# Patient Record
Sex: Male | Born: 1970 | Race: Black or African American | Hispanic: No | Marital: Married | Smoking: Current every day smoker
Health system: Southern US, Community
[De-identification: ages and names within clinical notes are randomized; demographics above are authoritative.]

## PROBLEM LIST (undated history)

## (undated) DIAGNOSIS — I671 Cerebral aneurysm, nonruptured: Secondary | ICD-10-CM

## (undated) HISTORY — PX: NASAL FRACTURE SURGERY: SHX718

## (undated) HISTORY — PX: BRAIN SURGERY: SHX531

---

## 2009-02-18 ENCOUNTER — Emergency Department (HOSPITAL_BASED_OUTPATIENT_CLINIC_OR_DEPARTMENT_OTHER): Admission: EM | Admit: 2009-02-18 | Discharge: 2009-02-18 | Payer: Self-pay | Admitting: Emergency Medicine

## 2009-03-25 ENCOUNTER — Ambulatory Visit: Payer: Self-pay | Admitting: Diagnostic Radiology

## 2009-03-25 ENCOUNTER — Emergency Department (HOSPITAL_BASED_OUTPATIENT_CLINIC_OR_DEPARTMENT_OTHER): Admission: EM | Admit: 2009-03-25 | Discharge: 2009-03-25 | Payer: Self-pay | Admitting: Emergency Medicine

## 2010-05-27 ENCOUNTER — Emergency Department (HOSPITAL_BASED_OUTPATIENT_CLINIC_OR_DEPARTMENT_OTHER)
Admission: EM | Admit: 2010-05-27 | Discharge: 2010-05-27 | Payer: Self-pay | Source: Home / Self Care | Admitting: Emergency Medicine

## 2010-07-30 LAB — DIFFERENTIAL
Basophils Absolute: 0.1 10*3/uL (ref 0.0–0.1)
Basophils Relative: 2 % — ABNORMAL HIGH (ref 0–1)
Eosinophils Absolute: 0.1 10*3/uL (ref 0.0–0.7)
Lymphs Abs: 2.2 10*3/uL (ref 0.7–4.0)
Neutrophils Relative %: 53 % (ref 43–77)

## 2010-07-30 LAB — COMPREHENSIVE METABOLIC PANEL
ALT: 48 U/L (ref 0–53)
CO2: 26 mEq/L (ref 19–32)
Calcium: 9.1 mg/dL (ref 8.4–10.5)
Chloride: 108 mEq/L (ref 96–112)
GFR calc non Af Amer: >60 mL/min (ref >60)
Glucose, Bld: 87 mg/dL (ref 70–99)
Sodium: 146 mEq/L — ABNORMAL HIGH (ref 135–145)
Total Bilirubin: 0.8 mg/dL (ref 0.3–1.2)

## 2010-07-30 LAB — URINALYSIS, ROUTINE W REFLEX MICROSCOPIC
Ketones, ur: 15 mg/dL — AB
Nitrite: NEGATIVE
Protein, ur: NEGATIVE mg/dL
Urobilinogen, UA: 0.2 mg/dL (ref 0.0–1.0)

## 2010-07-30 LAB — CBC
Hemoglobin: 14.1 g/dL (ref 13.0–17.0)
MCHC: 34.1 g/dL (ref 30.0–36.0)
MCV: 88.9 fL (ref 78.0–100.0)
RBC: 4.64 MIL/uL (ref 4.22–5.81)

## 2010-07-30 LAB — LIPASE, BLOOD: Lipase: 36 U/L (ref 23–300)

## 2010-07-31 LAB — CBC
Platelets: 162 10*3/uL (ref 150–400)
RDW: 13.6 % (ref 11.5–15.5)
WBC: 9.5 10*3/uL (ref 4.0–10.5)

## 2010-07-31 LAB — URINALYSIS, ROUTINE W REFLEX MICROSCOPIC
Glucose, UA: NEGATIVE mg/dL
Ketones, ur: 15 mg/dL — AB
Nitrite: NEGATIVE
Protein, ur: NEGATIVE mg/dL
pH: 8 (ref 5.0–8.0)

## 2010-07-31 LAB — BASIC METABOLIC PANEL
BUN: 12 mg/dL (ref 6–23)
Creatinine, Ser: 0.9 mg/dL (ref 0.4–1.5)
GFR calc non Af Amer: >60 mL/min (ref >60)
Glucose, Bld: 90 mg/dL (ref 70–99)

## 2010-07-31 LAB — RAPID STREP SCREEN (MED CTR MEBANE ONLY): Streptococcus, Group A Screen (Direct): NEGATIVE

## 2010-07-31 LAB — DIFFERENTIAL
Basophils Absolute: 0.1 10*3/uL (ref 0.0–0.1)
Eosinophils Absolute: 0.1 10*3/uL (ref 0.0–0.7)
Lymphocytes Relative: 7 % — ABNORMAL LOW (ref 12–46)
Lymphs Abs: 0.7 10*3/uL (ref 0.7–4.0)
Neutrophils Relative %: 85 % — ABNORMAL HIGH (ref 43–77)

## 2011-01-08 IMAGING — CR DG ABDOMEN ACUTE W/ 1V CHEST
3 series · 3 of 3 positions shown · non-contrast
Comparison: None

CLINICAL DATA: Lower abdominal pain, nausea, vomiting and diarrhea;
blood in stool.

ACUTE ABDOMEN SERIES (ABDOMEN 2 VIEW & CHEST 1 VIEW)

[w chest pa]
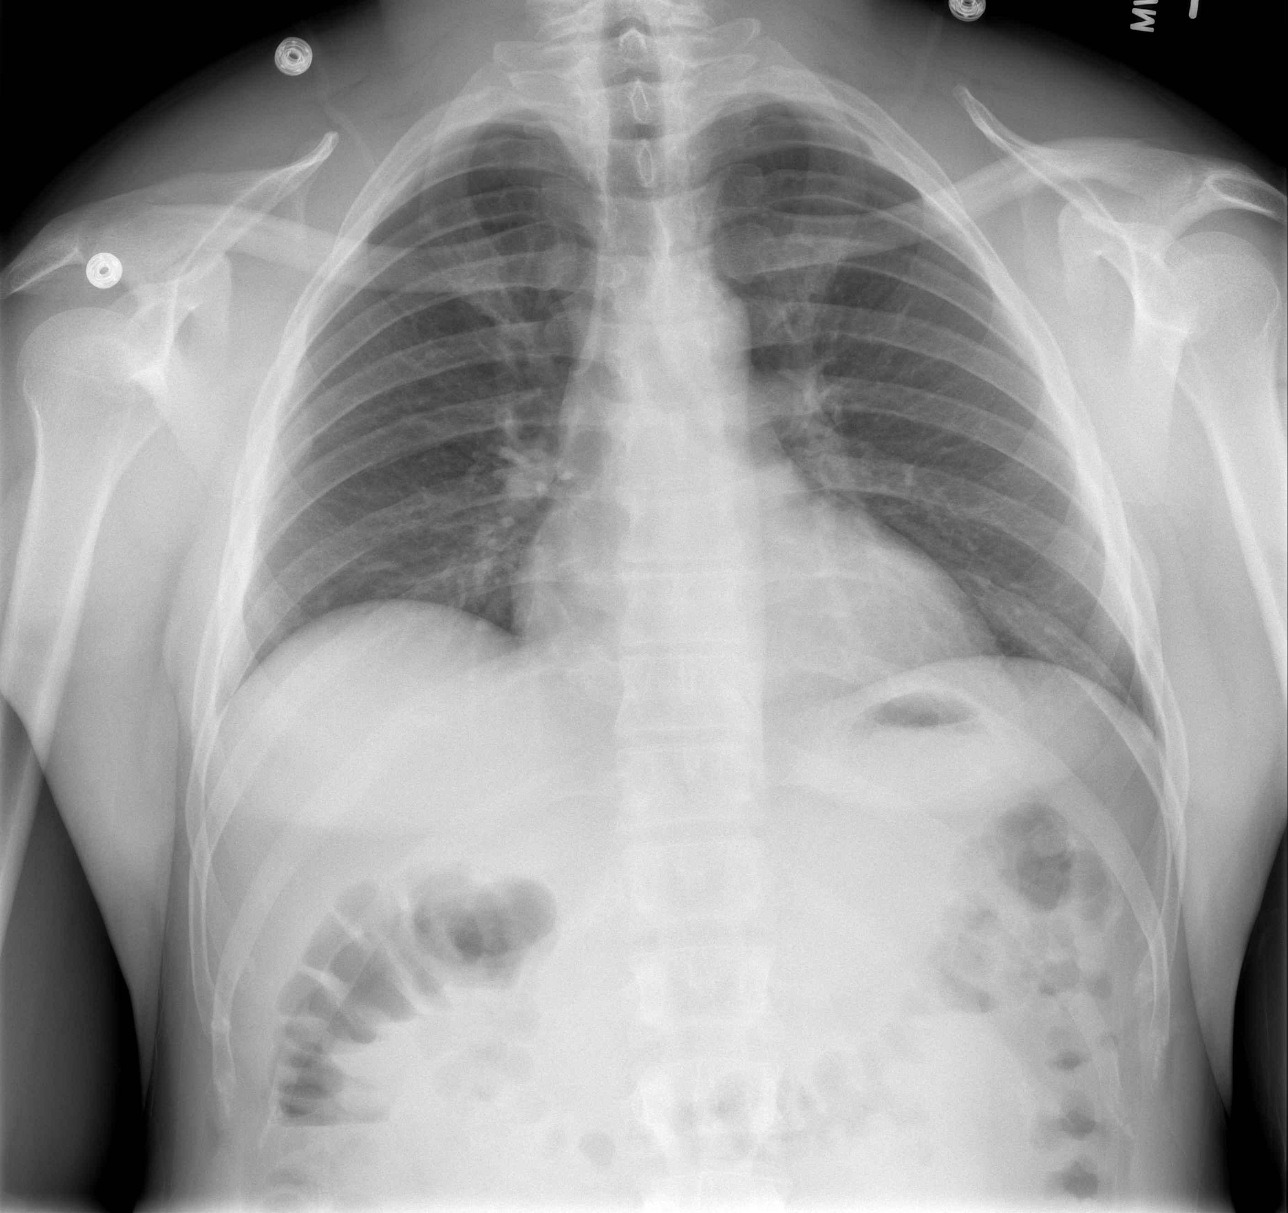

[w abdomen upright]
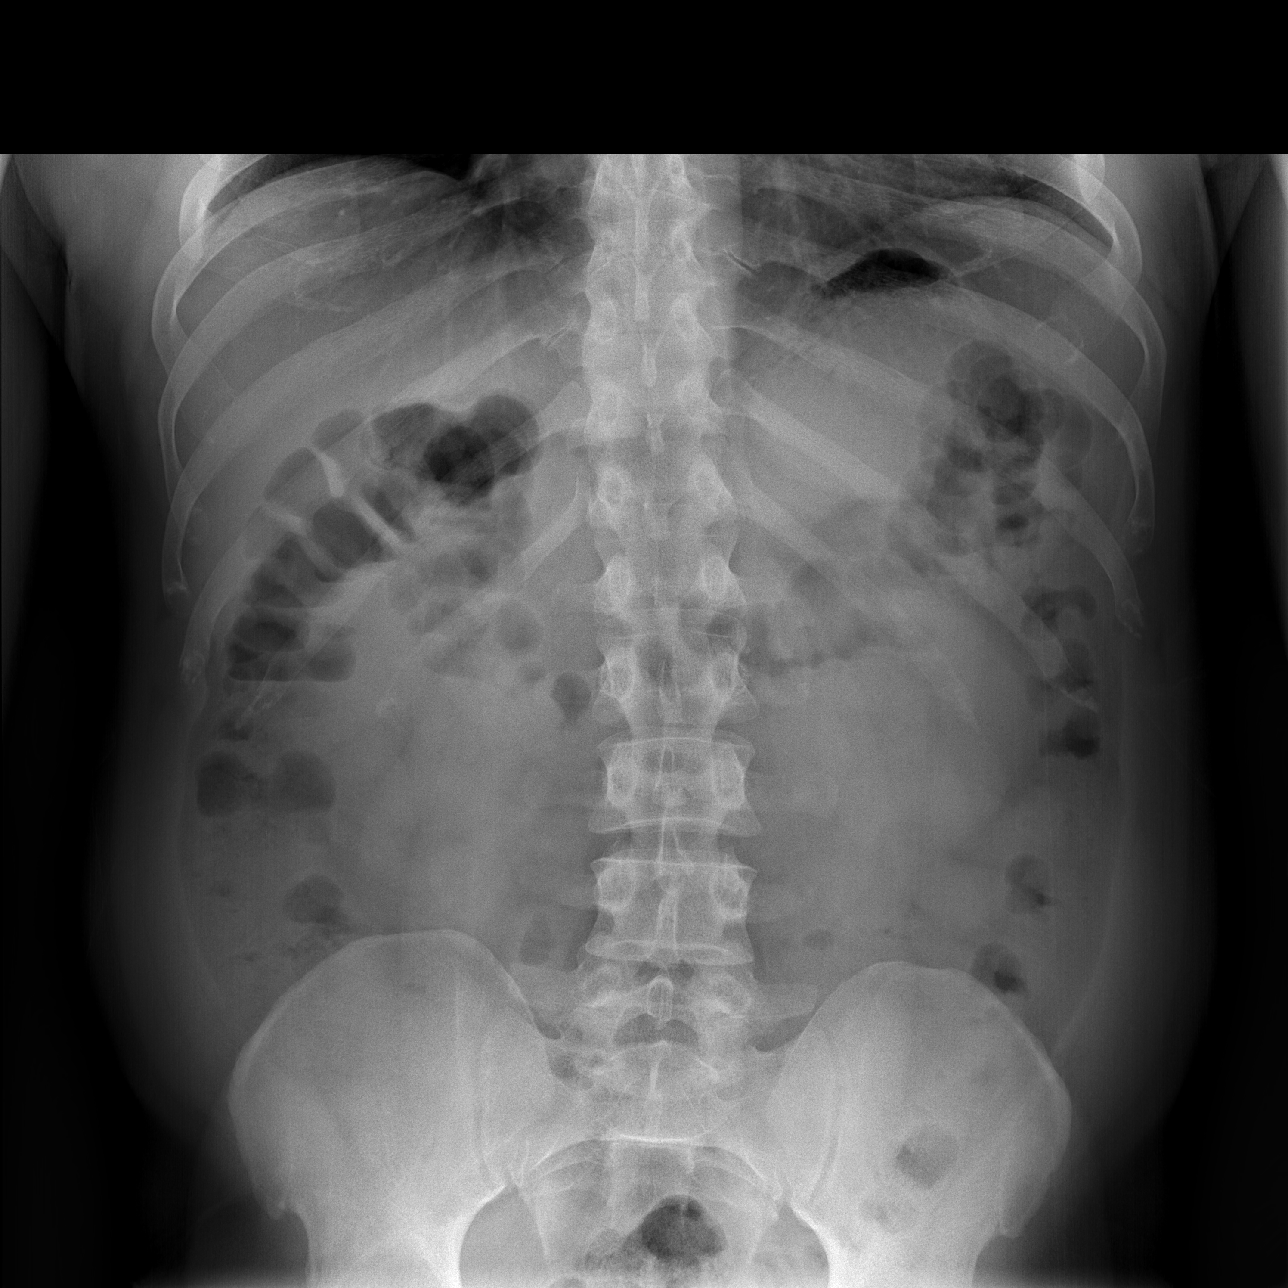

[t abdomen supine]
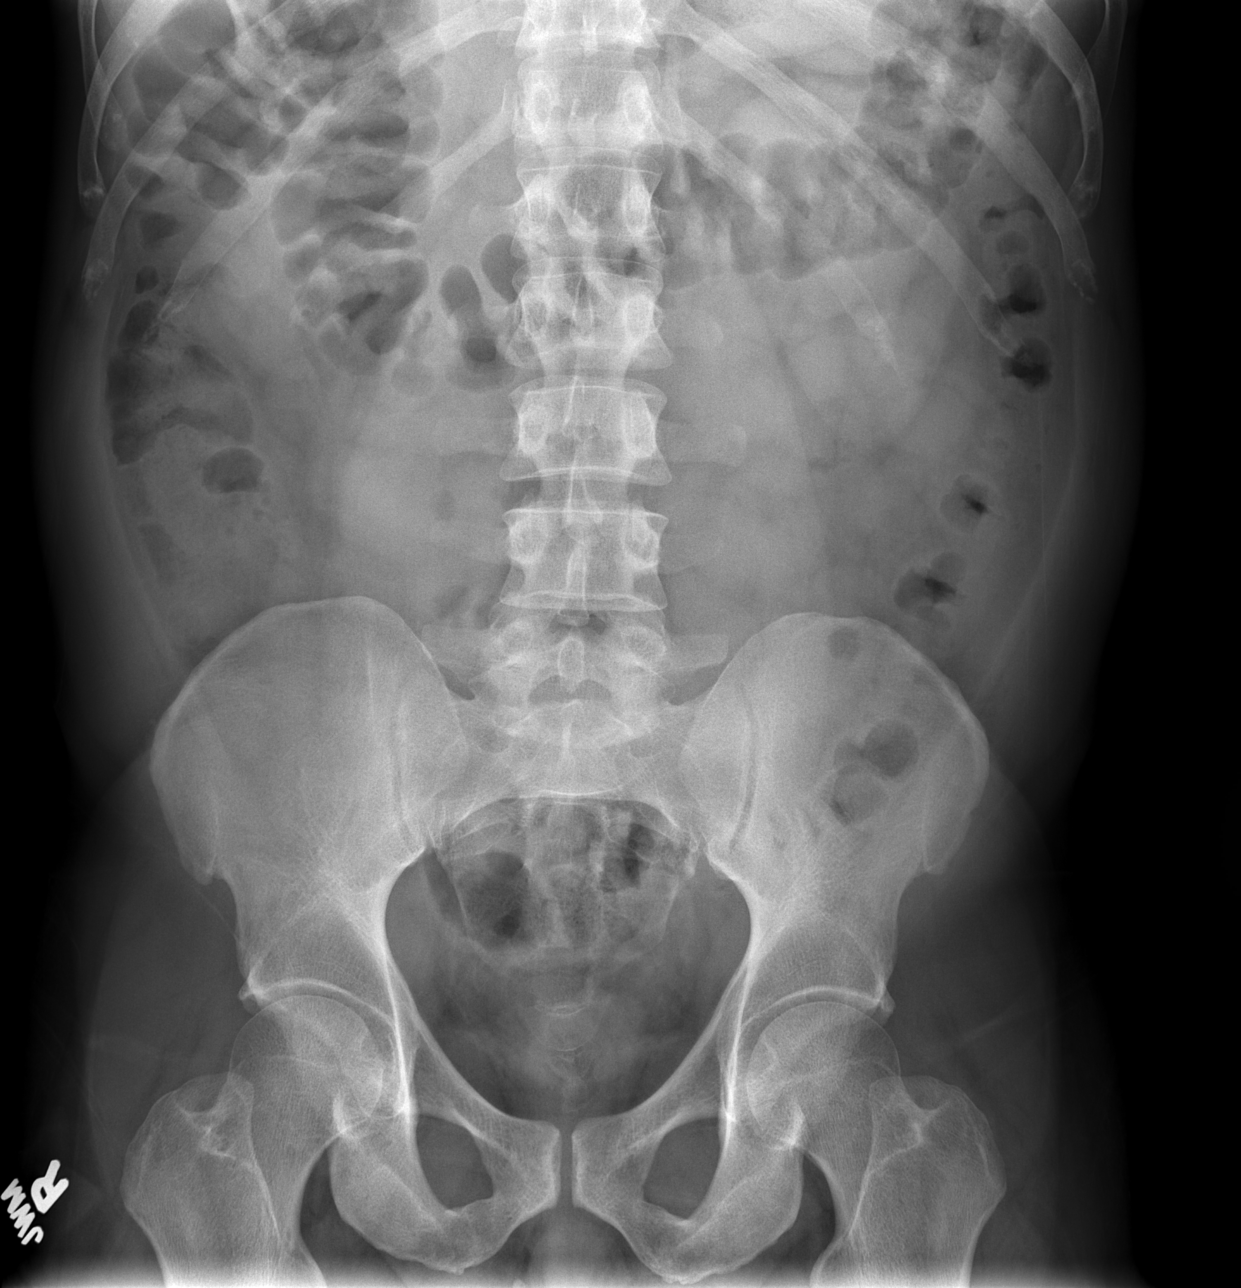

[3 of 3 positions shown; findings below may reference images not displayed]

FINDINGS: The lungs are well-aerated and clear.  There is no
evidence of focal opacification, pleural effusion or pneumothorax.
The cardiomediastinal silhouette is within normal limits.

The visualized bowel gas pattern is unremarkable. Stool and air are
noted within the colon; there is no evidence of small bowel
dilatation to suggest obstruction.  No free intra-abdominal air is
identified on the provided upright view.

No acute osseous abnormalities are seen; the sacroiliac joints are
unremarkable in appearance.
IMPRESSION: 1.  Unremarkable bowel gas pattern; no free intra-abdominal air
seen.
2.  No acute cardiopulmonary process identified.

## 2022-03-21 ENCOUNTER — Encounter (HOSPITAL_BASED_OUTPATIENT_CLINIC_OR_DEPARTMENT_OTHER): Payer: Self-pay | Admitting: Emergency Medicine

## 2022-03-21 ENCOUNTER — Emergency Department (HOSPITAL_BASED_OUTPATIENT_CLINIC_OR_DEPARTMENT_OTHER)
Admission: EM | Admit: 2022-03-21 | Discharge: 2022-03-21 | Disposition: A | Discharge status: Home / Self Care | Payer: 59 | Attending: Emergency Medicine | Admitting: Emergency Medicine

## 2022-03-21 ENCOUNTER — Other Ambulatory Visit: Payer: Self-pay

## 2022-03-21 ENCOUNTER — Emergency Department (HOSPITAL_BASED_OUTPATIENT_CLINIC_OR_DEPARTMENT_OTHER): Payer: 59

## 2022-03-21 DIAGNOSIS — G44019 Episodic cluster headache, not intractable: Secondary | ICD-10-CM | POA: Insufficient documentation

## 2022-03-21 DIAGNOSIS — R519 Headache, unspecified: Secondary | ICD-10-CM | POA: Diagnosis present

## 2022-03-21 HISTORY — DX: Cerebral aneurysm, nonruptured: I67.1

## 2022-03-21 LAB — BASIC METABOLIC PANEL
Anion gap: 5 (ref 5–15)
BUN: 11 mg/dL (ref 6–20)
CO2: 27 mmol/L (ref 22–32)
Calcium: 8.7 mg/dL — ABNORMAL LOW (ref 8.9–10.3)
Chloride: 106 mmol/L (ref 98–111)
Creatinine, Ser: 1.19 mg/dL (ref 0.61–1.24)
GFR, Estimated: >60 mL/min (ref >60)
Glucose, Bld: 86 mg/dL (ref 70–99)
Potassium: 4.2 mmol/L (ref 3.5–5.1)
Sodium: 138 mmol/L (ref 135–145)

## 2022-03-21 MED ORDER — DIPHENHYDRAMINE HCL 50 MG/ML IJ SOLN
25.0000 mg | Freq: Once | INTRAMUSCULAR | Status: AC
  Administered 2022-03-21: 25 mg via INTRAVENOUS
  Filled 2022-03-21: qty 1

## 2022-03-21 MED ORDER — PROCHLORPERAZINE EDISYLATE 10 MG/2ML IJ SOLN
10.0000 mg | Freq: Once | INTRAMUSCULAR | Status: AC
  Administered 2022-03-21: 10 mg via INTRAVENOUS
  Filled 2022-03-21: qty 2

## 2022-03-21 MED ORDER — IOHEXOL 350 MG/ML SOLN
75.0000 mL | Freq: Once | INTRAVENOUS | Status: AC | PRN
  Administered 2022-03-21: 80 mL via INTRAVENOUS

## 2022-03-21 MED ORDER — SODIUM CHLORIDE 0.9 % IV BOLUS
500.0000 mL | Freq: Once | INTRAVENOUS | Status: AC
  Administered 2022-03-21: 500 mL via INTRAVENOUS

## 2022-03-21 NOTE — Discharge Instructions (Signed)
Please read and follow all provided instructions.  Your diagnoses today include:  1. Episodic cluster headache, not intractable     Tests performed today include: CT of your head and neck focusing on the blood vessels which was normal and did not show any serious cause of your headache, such as bleeding or an aneurysm.  You have a old fracture around your eye.  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 Vital signs. See below for your results today.   Medications:  In the Emergency Department you received: Reglan - antinausea/headache medication Benadryl - antihistamine to counteract potential side effects of reglan  Take any prescribed medications only as directed.  Additional information:  Follow any educational materials contained in this packet.  Pick up the medication, rizatriptan, prescribed to you at your previous evaluation and try this for your headaches.  You are having a headache. No specific cause was found today for your headache. It may have been a migraine or other cause of headache. Stress, anxiety, fatigue, and depression are common triggers for headaches.   Your headache today does not appear to be life-threatening or require hospitalization, but often the exact cause of headaches is not determined in the emergency department. Therefore, follow-up with your doctor is very important to find out what may have caused your headache and whether or not you need any further diagnostic testing or treatment.   Sometimes headaches can appear benign (not harmful), but then more serious symptoms can develop which should prompt an immediate re-evaluation by your doctor or the emergency department.  BE VERY CAREFUL not to take multiple medicines containing Tylenol (also called acetaminophen). Doing so can lead to an overdose which can damage your liver and cause liver failure and possibly death.   Follow-up instructions: Please follow-up with your primary care provider  in the next 3 days for further evaluation of your symptoms.   Return instructions:  Please return to the Emergency Department if you experience worsening symptoms. Return if the medications do not resolve your headache, if it recurs, or if you have multiple episodes of vomiting or cannot keep down fluids. Return if you have a change from the usual headache. RETURN IMMEDIATELY IF you: Develop a sudden, severe headache Develop confusion or become poorly responsive or faint Develop a fever above 100.8F or problem breathing Have a change in speech, vision, swallowing, or understanding Develop new weakness, numbness, tingling, incoordination in your arms or legs Have a seizure Please return if you have any other emergent concerns.  Additional Information:  Your vital signs today were: BP (!) 129/99   Pulse 64   Temp 98.6 F (37 C)   Resp 16   Ht 5\' 6"  (1.676 m)   Wt 80.7 kg   SpO2 100%   BMI 28.73 kg/m  If your blood pressure (BP) was elevated above 135/85 this visit, please have this repeated by your doctor within one month. --------------

## 2022-03-21 NOTE — ED Provider Notes (Signed)
[facility] EMERGENCY DEPARTMENT Provider Note   CSN: [id number] Arrival date & time: 03/21/22  1058     History  Chief Complaint  Patient presents with   Headache    Jose Knapp is a 51 y.o. male.  Patient presents to the emergency department for evaluation of headache.  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  In his adult life he has had recurrent headaches.  Headaches are severe, usually behind the left eye with associated "crying" and runny nose.  Pain will be severe for about an hour before easing off.  He gets clusters of these, typically pain will recur after several hours to a day and last for about a month before resolving for several months.  States that he has never been diagnosed with cluster headaches or migraines.  He recently went to [facility] as he is currently getting daily headaches.  He was prescribed Phenergan, which he thought that he was taking for pain.  He has been taking these 3 times a day but they have not been helping.  He has also been taking a lot of ibuprofen which has been upsetting his stomach.  He was prescribed rizatriptan, but has not yet picked up.  Patient is concerned that pain could be related to aneurysm.  He had a noncontrast CT recently but cannot tell me the last time he had an angiogram.  He does not currently have a neurologist.      Home Medications Prior to Admission medications   Not on File      Allergies    Dilantin [phenytoin] and Penicillins    Review of Systems   Review of Systems  Physical Exam Updated Vital Signs BP 127/86 (BP Location: Right Arm)   Pulse 68   Temp 98.6 F (37 C) (Oral)   Resp 15   Ht 5\' 6"  (1.676 m)   Wt 80.7 kg   SpO2 100%   BMI 28.73 kg/m   Physical Exam Vitals and nursing note reviewed.  Constitutional:      Appearance: He is well-developed.  HENT:     Head: Normocephalic and atraumatic.     Right Ear: Tympanic membrane, ear canal and external ear  normal.     Left Ear: Tympanic membrane, ear canal and external ear normal.     Nose: Nose normal.     Mouth/Throat:     Pharynx: Uvula midline.  Eyes:     General: Lids are normal.     Conjunctiva/sclera: Conjunctivae normal.     Pupils: Pupils are equal, round, and reactive to light.  Cardiovascular:     Rate and Rhythm: Normal rate and regular rhythm.  Pulmonary:     Effort: Pulmonary effort is normal.     Breath sounds: Normal breath sounds.  Abdominal:     Palpations: Abdomen is soft.     Tenderness: There is no abdominal tenderness.  Musculoskeletal:        General: Normal range of motion.     Cervical back: Normal range of motion and neck supple. No tenderness or bony tenderness.  Skin:    General: Skin is warm and dry.  Neurological:     Mental Status: He is alert and oriented to person, place, and time.     GCS: GCS eye subscore is 4. GCS verbal subscore is 5. GCS motor subscore is 6.     Cranial Nerves: No cranial nerve deficit.     Sensory: No sensory  deficit.     Motor: No abnormal muscle tone.     Coordination: Coordination normal.     Gait: Gait normal.     ED Results / Procedures / Treatments   Labs (all labs ordered are listed, but only abnormal results are displayed) Labs Reviewed  BASIC METABOLIC PANEL - Abnormal; Notable for the following components:      Result Value   Calcium 8.7 (*)    All other components within normal limits    EKG None  Radiology CT ANGIO HEAD NECK W WO CM  Result Date: 03/21/2022 CLINICAL DATA:  Severe headache, history of aneurysm. EXAM: CT ANGIOGRAPHY HEAD AND NECK TECHNIQUE: Multidetector CT imaging of the head and neck was performed using the standard protocol during bolus administration of intravenous contrast. Multiplanar CT image reconstructions and MIPs were obtained to evaluate the vascular anatomy. Carotid stenosis measurements (when applicable) are obtained utilizing NASCET criteria, using the distal internal  carotid diameter as the denominator. RADIATION DOSE REDUCTION: This exam was performed according to the departmental dose-optimization program which includes automated exposure control, adjustment of the mA and/or kV according to patient size and/or use of iterative reconstruction technique. CONTRAST:  64mL OMNIPAQUE IOHEXOL 350 MG/ML SOLN COMPARISON:  CT head 03/14/2022. FINDINGS: CT HEAD FINDINGS Brain: There is no acute intracranial hemorrhage, extra-axial fluid collection, or acute infarct. 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 Parenchymal volume is otherwise normal. Gray-white differentiation is otherwise preserved. The ventricles are normal in size. There is no mass lesion.  There is no mass effect or midline shift. Vascular: See below. Skull: Postsurgical changes reflecting left parietal craniotomy. There is no acute osseous abnormality or suspicious osseous lesion. Sinuses/Orbits: There is a remote fracture of the left orbital floor with herniation of orbital fat in the inferior rectus muscle through the fracture. The paranasal sinuses are clear. The globes and orbits are otherwise unremarkable. Other: None. Review of the MIP images confirms the above findings CTA NECK FINDINGS Aortic arch: The imaged aortic arch is normal. The origins of the major branch vessels are patent. The subclavian arteries are patent to the level imaged. Right carotid system: The right common, internal, and external carotid arteries are patent, without hemodynamically significant stenosis or occlusion. There is no dissection or aneurysm. Left carotid system: The left common, internal, and external carotid arteries are patent, without hemodynamically significant stenosis or occlusion. There is no dissection or aneurysm. Vertebral arteries: The vertebral arteries are patent, without hemodynamically significant stenosis or occlusion. There is no dissection or aneurysm. Skeleton: There is multilevel degenerative  change of the cervical spine. There is no acute osseous abnormality or suspicious osseous lesion. There is no visible canal hematoma. Other neck: Soft tissues of the neck are unremarkable. Upper chest: The imaged lung apices are clear. Review of the MIP images confirms the above findings CTA HEAD FINDINGS Anterior circulation: The intracranial ICAs are patent. The bilateral MCAs are patent, without proximal stenosis or occlusion. The bilateral ACAs are patent, without proximal stenosis or occlusion. The anterior communicating artery is normal. There is no aneurysm or AVM. Posterior circulation: The bilateral V4 segments are patent, diminutive on the left. The basilar artery is patent. The major cerebellar arteries appear patent. The bilateral PCAs are patent, without proximal stenosis or occlusion. Posterior communicating arteries are not definitely seen. There is no aneurysm or AVM. Venous sinuses: Patent. Anatomic variants: None. Review of the MIP images confirms the above findings IMPRESSION: 1. No acute osseous abnormality. 2. 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 3. Normal vasculature of the head and neck with no hemodynamically significant stenosis, occlusion, dissection, or aneurysm. Electronically Signed   By: Lesia HausenPeter  Noone M.D.   On: 03/21/2022 18:19    Procedures Procedures    Medications Ordered in ED Medications  prochlorperazine (COMPAZINE) injection 10 mg (10 mg Intravenous Given 03/21/22 1653)  diphenhydrAMINE (BENADRYL) injection 25 mg (25 mg Intravenous Given 03/21/22 1652)  sodium chloride 0.9 % bolus 500 mL (500 mLs Intravenous New Bag/Given 03/21/22 1651)  iohexol (OMNIPAQUE) 350 MG/ML injection 75 mL (80 mLs Intravenous Contrast Given 03/21/22 1728)    ED Course/ Medical Decision Making/ A&P    Patient seen and examined. History obtained directly from patient.  I am unable to see [facility].  I do not see any  previous brain imaging in our system.  Due to concern for aneurysm, will check CTA of the head and neck today.  History is suggestive of cluster type headaches.  Headache pain is improving but he would like something for this.  Will go ahead and give GI cocktail as well.  Agree with trial of triptan therapy.  Will place referral to neurology on the patient's behalf.  If his headache becomes severe again, we will try high flow oxygen.  Labs/EKG: Ordered BMP.  Imaging: Ordered CTA head and neck.  Medications/Fluids: Ordered: IV Compazine, Benadryl, fluid bolus  Most recent vital signs reviewed and are as follows: BP 127/86 (BP Location: Right Arm)   Pulse 68   Temp 98.6 F (37 C) (Oral)   Resp 15   Ht 5\' 6"  (1.676 m)   Wt 80.7 kg   SpO2 100%   BMI 28.73 kg/m   Initial impression: Likely cluster headache  6:40 PM Reassessment performed. Patient appears stable.  States that he is feeling a bit better with treatment.  Labs personally reviewed and interpreted including: BMP, normal kidney function.  Imaging personally visualized and interpreted including: CTA head and neck, does not demonstrate any sign of aneurysm or other vascular problems in the head or neck, patient does have an old orbital floor fracture which he is aware of and chronic changes due to the brain surgery had as a child that appears stable.  Reviewed pertinent lab work and imaging with patient at bedside. Questions answered.   Most current vital signs reviewed and are as follows: BP (!) 129/99   Pulse 64   Temp 98.6 F (37 C)   Resp 16   Ht 5\' 6"  (1.676 m)   Wt 80.7 kg   SpO2 100%   BMI 28.73 kg/m   Plan: Discharge to home.   Prescriptions written for: None  Other home care instructions discussed: Trial of triptan prescribed by previous provider  ED return instructions discussed: Patient counseled to return if they have weakness in their arms or legs, slurred speech, trouble walking or talking, confusion,  trouble with their balance, or if they have any other concerns. Patient verbalizes understanding and agrees with plan.   Follow-up instructions discussed: Encouraged follow-up with PCP as needed.  I have placed ambulatory referral to neurology for patient to be evaluated for his episodic headaches, possible cluster headaches.                           Medical Decision Making Amount and/or Complexity of Data Reviewed Labs: ordered. Radiology: ordered.  Risk Prescription drug management.   In regards  to the patient's headache, critical differentials were considered including subarachnoid hemorrhage, intracerebral hemorrhage, epidural/subdural hematoma, pituitary apoplexy, vertebral/carotid artery dissection, giant cell arteritis, central venous thrombosis, reversible cerebral vasoconstriction, acute angle closure glaucoma, idiopathic intracranial hypertension, bacterial meningitis, viral encephalitis, carbon monoxide poisoning, posterior reversible encephalopathy syndrome, pre-eclampsia.   Reg flag symptoms related to these causes were considered including systemic symptoms (fever, weight loss), neurologic symptoms (confusion, mental status change, vision change, associated seizure), acute or sudden "thunderclap" onset, patient age 75 or older with new or progressive headache, patient of any age with first headache or change in headache pattern, pregnant or postpartum status, history of HIV or other immunocompromise, history of cancer, headache occurring with exertion, associated neck or shoulder pain, associated traumatic injury, concurrent use of anticoagulation, family history of spontaneous SAH, and concurrent drug use.    Other benign, more common causes of headache were considered including migraine, tension-type headache, cluster headache, referred pain from other cause such as sinus infection, dental pain, trigeminal neuralgia.   On exam, patient has a reassuring neuro exam including  baseline mental status, no significant neck pain or meningeal signs, no signs of severe infection or fever.   The patient's vital signs, pertinent lab work and imaging were reviewed and interpreted as discussed in the ED course. Hospitalization was considered for further testing, treatments, or serial exams/observation. However as patient is well-appearing, has a stable exam over the course of their evaluation, and reassuring studies today, I do not feel that they warrant admission at this time. This plan was discussed with the patient who verbalizes agreement and comfort with this plan and seems reliable and able to return to the Emergency Department with worsening or changing symptoms.          Final Clinical Impression(s) / ED Diagnoses Final diagnoses:  Episodic cluster headache, not intractable    Rx / DC Orders ED Discharge Orders          Ordered    Ambulatory referral to Neurology       Comments: An appointment is requested in approximately: 2 weeks for episodic headache evaluation   03/21/22 1838              Carlisle Cater, PA-C 03/21/22 [place], [place], DO 03/21/22 2318

## 2022-03-21 NOTE — ED Triage Notes (Signed)
Patient c/o headache off and on for years. Patient seen at ER in [place] on Tuesday and given medication for headache. Patient now has headache and vomiting.
